# Patient Record
Sex: Male | Born: 1985 | Race: Black or African American | Hispanic: No | Marital: Single | Smoking: Never smoker
Health system: Southern US, Community
[De-identification: ages and names within clinical notes are randomized; demographics above are authoritative.]

---

## 2011-07-01 ENCOUNTER — Emergency Department (HOSPITAL_COMMUNITY)
Admission: EM | Admit: 2011-07-01 | Discharge: 2011-07-01 | Disposition: A | Discharge status: Home / Self Care | Payer: Self-pay | Source: Home / Self Care | Attending: Emergency Medicine | Admitting: Emergency Medicine

## 2011-07-01 ENCOUNTER — Encounter (HOSPITAL_COMMUNITY): Payer: Self-pay | Admitting: Emergency Medicine

## 2011-07-01 DIAGNOSIS — J029 Acute pharyngitis, unspecified: Secondary | ICD-10-CM

## 2011-07-01 DIAGNOSIS — K122 Cellulitis and abscess of mouth: Secondary | ICD-10-CM

## 2011-07-01 LAB — POCT RAPID STREP A: Streptococcus, Group A Screen (Direct): NEGATIVE

## 2011-07-01 MED ORDER — AMOXICILLIN 500 MG PO CAPS: 500.0000 mg | ORAL_CAPSULE | Freq: Three times a day (TID) | ORAL | Status: AC

## 2011-07-01 MED ORDER — PREDNISONE 20 MG PO TABS: 40.0000 mg | ORAL_TABLET | Freq: Every day | ORAL | Status: AC

## 2011-07-01 NOTE — ED Provider Notes (Signed)
History     CSN: [id number]  Arrival date & time 07/01/11  9562   First MD Initiated Contact with Patient 07/01/11 1025      Chief Complaint  Patient presents with  . Sore Throat    (Consider location/radiation/quality/duration/timing/severity/associated sxs/prior treatment) HPI Comments: Patient has been complaining of a sore throat since Thursday as it has been worsening since then and he sees a tiny little black spot on the middle of his throat (points to uvula). "As it's been hurting more within the last 2 days when I swallow and it looks red" , I think have been having some type of sinus congestion for the last few days as well have some nasal congestion and postnasal dripping. No fevers, no cough or shortness of breath. Been using salt water gargles for my throat have not really taking anything else"   Patient is a 26 y.o. male presenting with pharyngitis. The history is provided by the patient.  Sore Throat This is a new problem. The current episode started more than 2 days ago. The problem occurs constantly. The problem has been gradually worsening. Pertinent negatives include no abdominal pain, no headaches and no shortness of breath. The symptoms are aggravated by swallowing. The symptoms are relieved by nothing. He has tried nothing for the symptoms.    History reviewed. No pertinent past medical history.  History reviewed. No pertinent past surgical history.  History reviewed. No pertinent family history.  History  Substance Use Topics  . Smoking status: Never Smoker   . Smokeless tobacco: Not on file  . Alcohol Use: No      Review of Systems  Constitutional: Negative for fever, chills, activity change and appetite change.  HENT: Positive for congestion, sore throat, rhinorrhea and sinus pressure. Negative for ear pain.   Respiratory: Negative for chest tightness, shortness of breath and wheezing.   Gastrointestinal: Negative for abdominal pain.  Neurological:  Negative for headaches.    Allergies  Review of patient's allergies indicates no known allergies.  Home Medications   Current Outpatient Rx  Name Route Sig Dispense Refill  . AMOXICILLIN 500 MG PO CAPS Oral Take 1 capsule (500 mg total) by mouth 3 (three) times daily. 30 capsule 0  . PREDNISONE 20 MG PO TABS Oral Take 2 tablets (40 mg total) by mouth daily. 2 tablets daily for 5 days 10 tablet 0    BP 136/85  Pulse 61  Temp(Src) 97.3 F (36.3 C) (Oral)  Resp 18  SpO2 98%  Physical Exam  Nursing note and vitals reviewed. Constitutional: He appears well-developed and well-nourished.  HENT:  Head: Normocephalic.  Mouth/Throat: Uvula swelling present. Posterior oropharyngeal erythema present.    Eyes: Conjunctivae are normal. Right eye exhibits discharge. Left eye exhibits no discharge.  Neck: Neck supple. No JVD present.  Lymphadenopathy:    He has no cervical adenopathy.    ED Course  Procedures (including critical care time)   Labs Reviewed  POCT RAPID STREP A ([facility] ONLY)   No results found.   1. Uvulitis   2. Pharyngitis       MDM  To localize uvula swelling with a ulceration and with localize left anterior pillar pharyngitis and localize swelling. No signs of pharyngeal peritonsillar abscess. Afebrile with a negative strep test. Have decided to treat patient with antibiotics as the ulceration in and itself looks secondarily infected also with a mild short course of steroid. Instructed patient to followup with [place] if no improvement is  noted after 3-5 days. Patient with treatment plan and agreed to followup as necessary.        Jimmie Molly, MD 07/01/11 [phone]

## 2011-07-01 NOTE — ED Notes (Signed)
C/o of sore throat since last thursday

## 2011-07-01 NOTE — Discharge Instructions (Signed)
Salt Water Gargle  This solution will help make your mouth and throat feel better.  HOME CARE INSTRUCTIONS     Mix 1 teaspoon of salt in 8 ounces of warm water.   Gargle with this solution as much or often as you need or as directed. Swish and gargle gently if you have any sores or wounds in your mouth.   Do not swallow this mixture.  Document Released: 11/02/2003 Document Revised: 01/17/2011 Document Reviewed: 03/25/2008  [employer] Patient Information 2012 [employer].

## 2013-11-03 ENCOUNTER — Ambulatory Visit
Admission: RE | Admit: 2013-11-03 | Discharge: 2013-11-03 | Disposition: A | Discharge status: Home / Self Care | Payer: No Typology Code available for payment source | Source: Ambulatory Visit | Attending: Occupational Medicine | Admitting: Occupational Medicine

## 2013-11-03 ENCOUNTER — Other Ambulatory Visit: Payer: Self-pay | Admitting: Occupational Medicine

## 2013-11-03 DIAGNOSIS — Z021 Encounter for pre-employment examination: Secondary | ICD-10-CM

## 2015-08-21 ENCOUNTER — Ambulatory Visit (INDEPENDENT_AMBULATORY_CARE_PROVIDER_SITE_OTHER): Payer: 59 | Admitting: Physician Assistant

## 2015-08-21 VITALS — BP 136/98 | HR 87 | Temp 98.0°F | Resp 18 | Ht 71.5 in | Wt 196.0 lb

## 2015-08-21 DIAGNOSIS — R300 Dysuria: Secondary | ICD-10-CM | POA: Diagnosis not present

## 2015-08-21 DIAGNOSIS — R350 Frequency of micturition: Secondary | ICD-10-CM

## 2015-08-21 DIAGNOSIS — Z7251 High risk heterosexual behavior: Secondary | ICD-10-CM | POA: Diagnosis not present

## 2015-08-21 LAB — POCT URINALYSIS DIP (MANUAL ENTRY)
BILIRUBIN UA: NEGATIVE
BILIRUBIN UA: NEGATIVE
Blood, UA: NEGATIVE
GLUCOSE UA: NEGATIVE
Leukocytes, UA: NEGATIVE
Nitrite, UA: NEGATIVE
Protein Ur, POC: NEGATIVE
SPEC GRAV UA: 1.01
UROBILINOGEN UA: 0.2
pH, UA: 7

## 2015-08-21 LAB — POC MICROSCOPIC URINALYSIS (UMFC): MUCUS RE: ABSENT

## 2015-08-21 LAB — POCT GLYCOSYLATED HEMOGLOBIN (HGB A1C): HEMOGLOBIN A1C: 5

## 2015-08-21 NOTE — Progress Notes (Signed)
08/21/2015 7:38 PM   DOB: [birth year] / MRN: [id number]  SUBJECTIVE:  AHIJAH DEVERY is a 30 y.o. male presenting for for the evaluation of urinary frequency which started 4 days ago.  Associated symptoms include no other symptoms and the patient denies dysuria, hematuria, abdominal pain, pelvic pain, cloudy malordorous urine, genital rash and genital irritation. The patients has tried nothing for these symptoms. He has a new sexual partner and would like to be screened for STI. Denies penile discharge, testicular pain, dysuria.   He has No Known Allergies.   He  has no past medical history on file.    He  reports that he has never smoked. He does not have any smokeless tobacco history on file. He reports that he does not drink alcohol. He  has no sexual activity history on file. The patient  has no past surgical history on file.  His family history includes Cancer in his mother; Hyperlipidemia in his father.  Review of Systems  Constitutional: Negative for fever and chills.  Neurological: Negative for dizziness and headaches.  Psychiatric/Behavioral: Negative for depression.    Problem list and medications reviewed and updated by myself where necessary, and exist elsewhere in the encounter.   OBJECTIVE:  BP 136/98 mmHg  Pulse 87  Temp(Src) 98 F (36.7 C) (Oral)  Resp 18  Ht 5' 11.5" (1.816 m)  Wt 196 lb (88.905 kg)  BMI 26.96 kg/m2  SpO2 99%  Physical Exam  Constitutional: He is oriented to person, place, and time.  Cardiovascular: Normal rate and regular rhythm.   Pulmonary/Chest: Effort normal and breath sounds normal.  Genitourinary: Testes normal and penis normal.  Neurological: He is alert and oriented to person, place, and time.  Skin: Skin is warm and dry.  Vitals reviewed.   Results for orders placed or performed in visit on 08/21/15 (from the past 48 hour(s))  POCT urinalysis dipstick     Status: Normal   Collection Time: 08/21/15  5:56 PM  Result Value Ref  Range   Color, UA yellow yellow   Clarity, UA clear clear   Glucose, UA negative negative   Bilirubin, UA negative negative   Ketones, POC UA negative negative   Spec Grav, UA 1.010    Blood, UA negative negative   pH, UA 7.0    Protein Ur, POC negative negative   Urobilinogen, UA 0.2    Nitrite, UA Negative Negative   Leukocytes, UA Negative Negative  POCT Microscopic Urinalysis ([facility])     Status: Abnormal   Collection Time: 08/21/15  5:56 PM  Result Value Ref Range   WBC,UR,HPF,POC None None WBC/hpf   RBC,UR,HPF,POC None None RBC/hpf   Bacteria Few (A) None, Too numerous to count   Mucus Absent Absent   Epithelial Cells, UR Per Microscopy Few (A) None, Too numerous to count cells/hpf  POCT glycosylated hemoglobin (Hb A1C)     Status: Normal   Collection Time: 08/21/15  6:59 PM  Result Value Ref Range   Hemoglobin A1C 5.0     ASSESSMENT AND PLAN:  Evans was seen today for urinary frequency and dysuria.  Diagnoses and all orders for this visit:  Urinary frequency: Work up negative thus far.  Advised he cut his water consumption to 0.5 gallons versus 1 gallon daily.  -     POCT urinalysis dipstick -     POCT Microscopic Urinalysis ([facility]) -     POCT glycosylated hemoglobin (Hb A1C) -     COMPLETE  METABOLIC PANEL WITH GFR  High risk sexual behavior: Will screen.  -     HIV antibody -     RPR -     GC/Chlamydia Probe Amp    The patient was advised to call or return to clinic if he does not see an improvement in symptoms or to seek the care of the closest emergency department if he worsens with the above plan.   Deliah BostonMichael Clark, MHS, PA-C [facility] 08/21/2015 7:38 PM

## 2015-08-21 NOTE — Patient Instructions (Signed)
     IF you received an x-ray today, you will receive an invoice from [facility]. Please contact [facility] at [phone] with questions or concerns regarding your invoice.   IF you received labwork today, you will receive an invoice from [facility]. Please contact [facility] at [phone] with questions or concerns regarding your invoice.   Our billing staff will not be able to assist you with questions regarding bills from these companies.  You will be contacted with the lab results as soon as they are available. The fastest way to get your results is to activate your My Chart account. Instructions are located on the last page of this paperwork. If you have not heard from us regarding the results in 2 weeks, please contact this office.      

## 2015-08-22 LAB — COMPLETE METABOLIC PANEL WITH GFR
ALT: 17 U/L (ref 9–46)
AST: 21 U/L (ref 10–40)
Albumin: 4.4 g/dL (ref 3.6–5.1)
Alkaline Phosphatase: 63 U/L (ref 40–115)
BUN: 10 mg/dL (ref 7–25)
CALCIUM: 9.2 mg/dL (ref 8.6–10.3)
CHLORIDE: 102 mmol/L (ref 98–110)
CO2: 25 mmol/L (ref 20–31)
CREATININE: 1.12 mg/dL (ref 0.60–1.35)
Glucose, Bld: 94 mg/dL (ref 65–99)
POTASSIUM: 3.8 mmol/L (ref 3.5–5.3)
Sodium: 141 mmol/L (ref 135–146)
Total Bilirubin: 0.6 mg/dL (ref 0.2–1.2)
Total Protein: 7.2 g/dL (ref 6.1–8.1)

## 2015-08-22 LAB — HIV ANTIBODY (ROUTINE TESTING W REFLEX): HIV: NONREACTIVE

## 2015-08-23 LAB — GC/CHLAMYDIA PROBE AMP
CT Probe RNA: NOT DETECTED
GC PROBE AMP APTIMA: NOT DETECTED

## 2015-08-23 LAB — RPR

## 2015-10-24 MED FILL — FLUTICASONE PROP 50 MCG SPR: 50 | 30 days supply | Qty: 16 | Fill #1

## 2016-05-20 ENCOUNTER — Ambulatory Visit
Admission: RE | Admit: 2016-05-20 | Discharge: 2016-05-20 | Disposition: A | Discharge status: Home / Self Care | Payer: No Typology Code available for payment source | Source: Ambulatory Visit | Attending: Occupational Medicine | Admitting: Occupational Medicine

## 2016-05-20 ENCOUNTER — Other Ambulatory Visit: Payer: Self-pay | Admitting: Occupational Medicine

## 2016-05-20 DIAGNOSIS — Z021 Encounter for pre-employment examination: Secondary | ICD-10-CM

## 2018-10-31 IMAGING — CR DG CHEST 1V
1 series · 1 of 1 positions shown · non-contrast
Comparison: 11/03/2013

CLINICAL DATA: Pre-employment examination

EXAM:
CHEST 1 VIEW

[w chest pa]
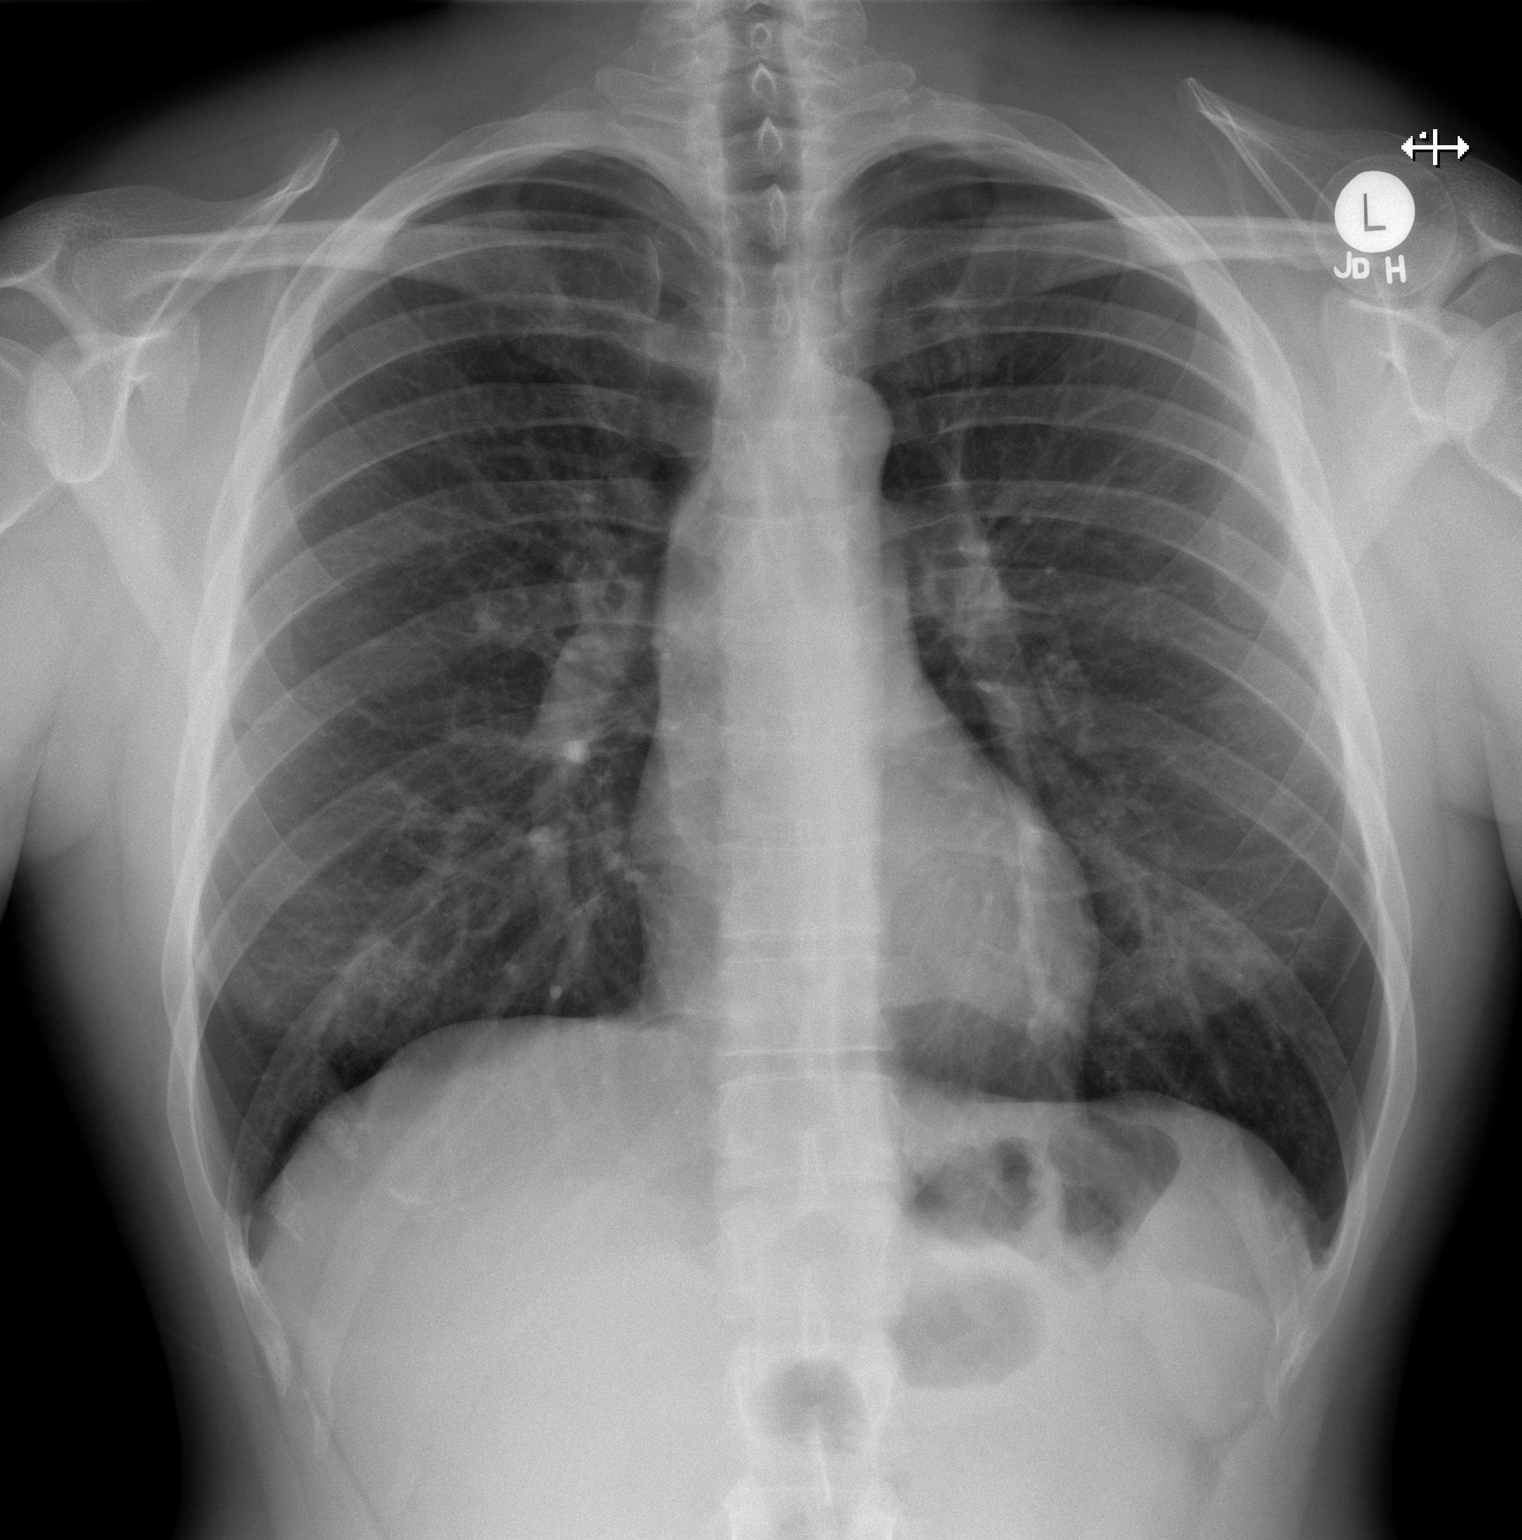

[1 of 1 positions shown; findings below may reference images not displayed]

FINDINGS: Normal heart size, mediastinal contours, and pulmonary vascularity.

Lungs clear.

No pleural effusion or pneumothorax.

Bones unremarkable.
IMPRESSION: Normal exam.

## 2020-06-28 ENCOUNTER — Emergency Department (INDEPENDENT_AMBULATORY_CARE_PROVIDER_SITE_OTHER): Payer: 59

## 2020-06-28 ENCOUNTER — Emergency Department (INDEPENDENT_AMBULATORY_CARE_PROVIDER_SITE_OTHER)
Admission: RE | Admit: 2020-06-28 | Discharge: 2020-06-28 | Disposition: A | Discharge status: Home / Self Care | Payer: 59 | Source: Ambulatory Visit

## 2020-06-28 ENCOUNTER — Other Ambulatory Visit: Payer: Self-pay

## 2020-06-28 VITALS — BP 143/81 | HR 66 | Temp 98.2°F | Resp 18

## 2020-06-28 DIAGNOSIS — R1032 Left lower quadrant pain: Secondary | ICD-10-CM

## 2020-06-28 NOTE — ED Triage Notes (Signed)
Pt c/o lower LT sided abd pain x 2 days. Pain is sharp and intemittent. Denies N/V/D or fever. Pain 6.5/10.

## 2020-06-28 NOTE — ED Provider Notes (Signed)
[facility]    CSN: [id number] Arrival date & time: 06/28/20  1306      History   Chief Complaint Chief Complaint  Patient presents with  . Abdominal Pain    APPT 2PM, LT side    HPI Larry Ballard is a 35 y.o. male.   HPI 35 year old male presents with left lower quadrant abdominal pain for 2 days.  Patient concurrently rates pain as 7 of 10; however, yesterday and the day before he reports lower left abdominal pain was sharp radiating to lower back.  Denies fever, nausea, vomiting, diarrhea, hematochezia, or melena.   History reviewed. No pertinent past medical history.  There are no problems to display for this patient.   History reviewed. No pertinent surgical history.     Home Medications    Prior to Admission medications   Not on File    Family History Family History  Problem Relation Age of Onset  . Cancer Mother   . Hyperlipidemia Father     Social History Social History   Tobacco Use  . Smoking status: Never Smoker  Vaping Use  . Vaping Use: Never used  Substance Use Topics  . Alcohol use: No     Allergies   Patient has no known allergies.   Review of Systems Review of Systems  Constitutional: Negative.   HENT: Negative.   Eyes: Negative.   Respiratory: Negative.   Cardiovascular: Negative.   Gastrointestinal: Positive for abdominal pain.  Genitourinary: Negative.   Musculoskeletal: Negative.   Skin: Negative.   Neurological: Negative.      Physical Exam Triage Vital Signs ED Triage Vitals  Enc Vitals Group     BP 06/28/20 1336 (!) 143/81     Pulse Rate 06/28/20 1336 66     Resp 06/28/20 1336 18     Temp 06/28/20 1336 98.2 F (36.8 C)     Temp Source 06/28/20 1336 Oral     SpO2 06/28/20 1336 99 %     Weight --      Height --      Head Circumference --      Peak Flow --      Pain Score 06/28/20 1338 6     Pain Loc --      Pain Edu? --      Excl. in GC? --    No data found.  Updated Vital Signs BP  (!) 143/81 (BP Location: Right Arm)   Pulse 66   Temp 98.2 F (36.8 C) (Oral)   Resp 18   SpO2 99%   Physical Exam Vitals and nursing note reviewed.  Constitutional:      General: He is not in acute distress.    Appearance: He is well-developed. He is not ill-appearing.  HENT:     Head: Normocephalic and atraumatic.     Mouth/Throat:     Mouth: Mucous membranes are moist.     Pharynx: Oropharynx is clear. No pharyngeal swelling or oropharyngeal exudate.  Eyes:     Extraocular Movements: Extraocular movements intact.     Pupils: Pupils are equal, round, and reactive to light.  Cardiovascular:     Rate and Rhythm: Normal rate and regular rhythm.     Heart sounds: Normal heart sounds.  Pulmonary:     Effort: Pulmonary effort is normal.     Breath sounds: Normal breath sounds.     Comments: No adventitious breath sounds noted Abdominal:     General: Bowel sounds are  absent. There is no distension.     Palpations: Abdomen is soft. There is no shifting dullness, fluid wave, hepatomegaly, splenomegaly, mass or pulsatile mass.     Tenderness: There is abdominal tenderness in the left lower quadrant. There is no right CVA tenderness, left CVA tenderness, guarding or rebound. Negative signs include Murphy's sign and McBurney's sign.  Skin:    General: Skin is warm and dry.  Neurological:     General: No focal deficit present.     Mental Status: He is alert and oriented to person, place, and time.  Psychiatric:        Mood and Affect: Mood normal.        Behavior: Behavior normal.      UC Treatments / Results  Labs (all labs ordered are listed, but only abnormal results are displayed) Labs Reviewed - No data to display  EKG   Radiology CT ABDOMEN PELVIS WO CONTRAST  Result Date: 06/28/2020 CLINICAL DATA:  Left-sided pain for 2 days. Contrast withheld at clinical service request. EXAM: CT ABDOMEN AND PELVIS WITHOUT CONTRAST TECHNIQUE: Multidetector CT imaging of the abdomen  and pelvis was performed following the standard protocol without IV contrast. COMPARISON:  None. FINDINGS: Lower chest: Clear lung bases. Normal heart size without pericardial or pleural effusion. Hepatobiliary: Normal liver. Normal gallbladder, without biliary ductal dilatation. Pancreas: Normal, without mass or ductal dilatation. Spleen: Normal in size, without focal abnormality. Adrenals/Urinary Tract: Normal adrenal glands. No renal calculi or hydronephrosis. No hydroureter or ureteric calculi. Left pelvic calcifications, including on 79/2, are favored to be separate from the ureter. No bladder calculi. Stomach/Bowel: Normal stomach, without wall thickening. Normal colon and terminal ileum. Appendix not visualized. Normal small bowel. Vascular/Lymphatic: Normal caliber of the aorta and branch vessels. No abdominopelvic adenopathy. Reproductive: Normal prostate. Other: No significant free fluid.  No free intraperitoneal air. Musculoskeletal: Heterogeneous sclerosis involving the right iliac bone including at 2.7 cm with extension beyond the cortex. Example 51/5. Mild disc bulges at L4-5 and L5-S1. IMPRESSION: 1. No upper tract calculi or obstructive uropathy. Left pelvic calcifications which are favored to be separate from the ureter, especially given absence of obstructive signs. Correlate with urinalysis. 2. No other explanation for patient's symptoms. 3. 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 Recommend nonemergent pelvic plain films, which will allow ongoing surveillance. Electronically Signed   By: Jeronimo Greaves M.D.   On: 06/28/2020 15:19    Procedures Procedures (including critical care time)  Medications Ordered in UC Medications - No data to display  Initial Impression / Assessment and Plan / UC Course  I have reviewed the triage vital signs and the nursing notes.  Pertinent labs & imaging results that were available during my care of the  patient were reviewed by me and considered in my medical decision making (see chart for details).    MDM: 1. LLQ Abdominal pain.  Patient provided findings of CT of abdomen and pelvis with radiology recommendations of nonemergent pelvic plain film to allow ongoing surveillance of benign entity (sclerotic lesion) from the right iliac crest. Final Clinical Impressions(s) / UC Diagnoses   Final diagnoses:  Abdominal pain, left lower quadrant     Discharge Instructions     CT of abdomen and pelvis without contrast revealed no upper tract calculi or obstructive uropathy left pelvic calcifications which are favored to be separate from the ureter especially given absence of obstructive signs.  Right iliac sclerotic lesion is favored  to represent a benign entity such as melorrheatosis.  Recommended nonemergent pelvic plain films will allow ongoing surveillance.  Advised patient to have repeat x-ray of pelvis in 3 months.    ED Prescriptions    None     PDMP not reviewed this encounter.   Trevor Iha, FNP 06/28/20 1554

## 2020-06-28 NOTE — Discharge Instructions (Signed)
CT of abdomen and pelvis without contrast revealed no upper tract calculi or obstructive uropathy left pelvic calcifications which are favored to be separate from the ureter especially given absence of obstructive signs.  Right iliac sclerotic lesion is favored to represent a benign entity such as melorrheatosis.  Recommended nonemergent pelvic plain films will allow ongoing surveillance.  Advised patient to have repeat x-ray of pelvis in 3 months.

## 2020-07-05 ENCOUNTER — Other Ambulatory Visit: Payer: Self-pay | Admitting: Internal Medicine

## 2020-07-05 ENCOUNTER — Ambulatory Visit
Admission: RE | Admit: 2020-07-05 | Discharge: 2020-07-05 | Disposition: A | Discharge status: Home / Self Care | Payer: 59 | Source: Ambulatory Visit | Attending: Internal Medicine | Admitting: Internal Medicine

## 2020-07-05 DIAGNOSIS — R935 Abnormal findings on diagnostic imaging of other abdominal regions, including retroperitoneum: Secondary | ICD-10-CM

## 2021-07-29 ENCOUNTER — Emergency Department (HOSPITAL_BASED_OUTPATIENT_CLINIC_OR_DEPARTMENT_OTHER)
Admission: EM | Admit: 2021-07-29 | Discharge: 2021-07-29 | Disposition: A | Discharge status: Home / Self Care | Payer: No Typology Code available for payment source | Attending: Emergency Medicine | Admitting: Emergency Medicine

## 2021-07-29 ENCOUNTER — Other Ambulatory Visit: Payer: Self-pay

## 2021-07-29 ENCOUNTER — Emergency Department (HOSPITAL_BASED_OUTPATIENT_CLINIC_OR_DEPARTMENT_OTHER): Payer: No Typology Code available for payment source

## 2021-07-29 ENCOUNTER — Encounter (HOSPITAL_BASED_OUTPATIENT_CLINIC_OR_DEPARTMENT_OTHER): Payer: Self-pay | Admitting: Emergency Medicine

## 2021-07-29 DIAGNOSIS — R079 Chest pain, unspecified: Secondary | ICD-10-CM | POA: Insufficient documentation

## 2021-07-29 DIAGNOSIS — R1011 Right upper quadrant pain: Secondary | ICD-10-CM | POA: Diagnosis not present

## 2021-07-29 DIAGNOSIS — M7918 Myalgia, other site: Secondary | ICD-10-CM

## 2021-07-29 LAB — CBC WITH DIFFERENTIAL/PLATELET
Abs Immature Granulocytes: 0.03 10*3/uL (ref 0.00–0.07)
Basophils Absolute: 0 10*3/uL (ref 0.0–0.1)
Basophils Relative: 0 %
Eosinophils Absolute: 0.1 10*3/uL (ref 0.0–0.5)
Eosinophils Relative: 1 %
HCT: 45.3 % (ref 39.0–52.0)
Hemoglobin: 15.8 g/dL (ref 13.0–17.0)
Immature Granulocytes: 0 %
Lymphocytes Relative: 17 %
Lymphs Abs: 1.6 10*3/uL (ref 0.7–4.0)
MCH: 29.8 pg (ref 26.0–34.0)
MCHC: 34.9 g/dL (ref 30.0–36.0)
MCV: 85.5 fL (ref 80.0–100.0)
Monocytes Absolute: 0.9 10*3/uL (ref 0.1–1.0)
Monocytes Relative: 10 %
Neutro Abs: 6.7 10*3/uL (ref 1.7–7.7)
Neutrophils Relative %: 72 %
Platelets: 192 10*3/uL (ref 150–400)
RBC: 5.3 MIL/uL (ref 4.22–5.81)
RDW: 12.6 % (ref 11.5–15.5)
WBC: 9.4 10*3/uL (ref 4.0–10.5)
nRBC: 0 % (ref 0.0–0.2)

## 2021-07-29 LAB — URINALYSIS, ROUTINE W REFLEX MICROSCOPIC
Bilirubin Urine: NEGATIVE
Glucose, UA: NEGATIVE mg/dL
Hgb urine dipstick: NEGATIVE
Ketones, ur: NEGATIVE mg/dL
Leukocytes,Ua: NEGATIVE
Nitrite: NEGATIVE
Protein, ur: NEGATIVE mg/dL
Specific Gravity, Urine: 1.01 (ref 1.005–1.030)
pH: 7 (ref 5.0–8.0)

## 2021-07-29 LAB — COMPREHENSIVE METABOLIC PANEL
ALT: 26 U/L (ref 0–44)
AST: 22 U/L (ref 15–41)
Albumin: 4.2 g/dL (ref 3.5–5.0)
Alkaline Phosphatase: 65 U/L (ref 38–126)
Anion gap: 4 — ABNORMAL LOW (ref 5–15)
BUN: 14 mg/dL (ref 6–20)
CO2: 27 mmol/L (ref 22–32)
Calcium: 9 mg/dL (ref 8.9–10.3)
Chloride: 105 mmol/L (ref 98–111)
Creatinine, Ser: 0.92 mg/dL (ref 0.61–1.24)
GFR, Estimated: >60 mL/min (ref >60)
Glucose, Bld: 98 mg/dL (ref 70–99)
Potassium: 3.8 mmol/L (ref 3.5–5.1)
Sodium: 136 mmol/L (ref 135–145)
Total Bilirubin: 1.3 mg/dL — ABNORMAL HIGH (ref 0.3–1.2)
Total Protein: 7.7 g/dL (ref 6.5–8.1)

## 2021-07-29 LAB — TROPONIN I (HIGH SENSITIVITY): Troponin I (High Sensitivity): 3 ng/L (ref <18)

## 2021-07-29 MED ORDER — LACTATED RINGERS IV BOLUS
1000.0000 mL | Freq: Once | INTRAVENOUS | Status: AC
  Administered 2021-07-29: 1000 mL via INTRAVENOUS

## 2021-07-29 MED ORDER — IOHEXOL 300 MG/ML  SOLN
100.0000 mL | Freq: Once | INTRAMUSCULAR | Status: AC | PRN
  Administered 2021-07-29: 100 mL via INTRAVENOUS

## 2021-07-29 NOTE — Discharge Instructions (Signed)
Your work-up today was overall very reassuring.  Your CT was negative as well as your labs.  I suspect that you have some muscular pain meds causing him discomfort.  You can take Motrin or Tylenol as needed for your pain as well as put on lidocaine patches.  You can get these over-the-counter from any drugstore-they are called Salonpas.

## 2021-07-29 NOTE — ED Triage Notes (Signed)
Patient states that he started having chest pain Friday but the pain got stronger last night .States that the pain radiate down to his right side

## 2021-07-29 NOTE — ED Provider Notes (Signed)
[facility] EMERGENCY DEPARTMENT Provider Note   CSN: [id number] Arrival date & time: 07/29/21  1012     History  Chief Complaint  Patient presents with   Chest Pain    Larry Ballard is a 36 y.o. male who presents to the ED for evaluation of right-sided lower chest pain radiating down into his right lower quadrant.  Pain started approximately 2 days ago and has been progressively worsening.  He describes the pain as sharp/shooting and worse when he is bending over or twisting to the side.  No treatment prior to arrival.  He denies fever, chills, urinary symptoms, cough, congestion shortness of breath, nausea, vomiting and diarrhea.  No previous history of cardiac disease or gallbladder issues.  Chest Pain      Home Medications Prior to Admission medications   Not on File      Allergies    Patient has no known allergies.    Review of Systems   Review of Systems  Cardiovascular:  Positive for chest pain.    Physical Exam Updated Vital Signs BP (!) 126/91   Pulse 66   Temp 98.4 F (36.9 C) (Oral)   Resp 14   SpO2 100%  Physical Exam Vitals and nursing note reviewed.  Constitutional:      General: He is not in acute distress.    Appearance: He is not ill-appearing.  HENT:     Head: Atraumatic.  Eyes:     Conjunctiva/sclera: Conjunctivae normal.  Cardiovascular:     Rate and Rhythm: Normal rate and regular rhythm.     Pulses: Normal pulses.          Radial pulses are 2+ on the right side and 2+ on the left side.       Dorsalis pedis pulses are 2+ on the right side and 2+ on the left side.     Heart sounds: No murmur heard. Pulmonary:     Effort: Pulmonary effort is normal. No respiratory distress.     Breath sounds: Normal breath sounds.  Chest:     Chest wall: No deformity or tenderness.  Abdominal:     General: Abdomen is flat. There is no distension.     Palpations: Abdomen is soft.     Tenderness: There is abdominal tenderness in the right  upper quadrant.     Comments: Mild RUQ tenderness, negative Murphy, negative McBurney sign  Musculoskeletal:        General: Normal range of motion.     Cervical back: Normal range of motion.  Skin:    General: Skin is warm and dry.     Capillary Refill: Capillary refill takes less than 2 seconds.  Neurological:     General: No focal deficit present.     Mental Status: He is alert.  Psychiatric:        Mood and Affect: Mood normal.     ED Results / Procedures / Treatments   Labs (all labs ordered are listed, but only abnormal results are displayed) Labs Reviewed  COMPREHENSIVE METABOLIC PANEL - Abnormal; Notable for the following components:      Result Value   Total Bilirubin 1.3 (*)    Anion gap 4 (*)    All other components within normal limits  CBC WITH DIFFERENTIAL/PLATELET  URINALYSIS, ROUTINE W REFLEX MICROSCOPIC  TROPONIN I (HIGH SENSITIVITY)  TROPONIN I (HIGH SENSITIVITY)    EKG EKG Interpretation  Date/Time:  Sunday July 29 2021 10:21:59 EDT Ventricular Rate:  70 PR Interval:  170 QRS Duration: 100 QT Interval:  391 QTC Calculation: 422 R Axis:   163 Text Interpretation: Sinus rhythm Right axis deviation No previous ECGs available Confirmed by Alvira Monday ([id number]) on 07/29/2021 11:23:23 AM  Radiology CT ABDOMEN PELVIS W CONTRAST  Result Date: 07/29/2021 CLINICAL DATA:  36 year old male history of flank pain. EXAM: CT ABDOMEN AND PELVIS WITH CONTRAST TECHNIQUE: Multidetector CT imaging of the abdomen and pelvis was performed using the standard protocol following bolus administration of intravenous contrast. RADIATION DOSE REDUCTION: This exam was performed according to the departmental dose-optimization program which includes automated exposure control, adjustment of the mA and/or kV according to patient size and/or use of iterative reconstruction technique. CONTRAST:  OMNIPAQUE IOHEXOL 300 MG/ML  SOLN COMPARISON:  CT the abdomen and pelvis 06/28/2020.  FINDINGS: Lower chest: Unremarkable. Hepatobiliary: No suspicious cystic or solid hepatic lesions. No intra or extrahepatic biliary ductal dilatation. Gallbladder is normal in appearance. Pancreas: No pancreatic mass. No pancreatic ductal dilatation. No pancreatic or peripancreatic fluid collections or inflammatory changes. Spleen: Unremarkable. Adrenals/Urinary Tract: Bilateral kidneys and bilateral adrenal glands are normal in appearance. No hydroureteronephrosis. Urinary bladder is normal in appearance. Stomach/Bowel: The appearance of the stomach is normal. No pathologic dilatation of small bowel or colon. The appendix is not confidently identified and may be surgically absent. Regardless, there are no inflammatory changes noted adjacent to the cecum to suggest the presence of an acute appendicitis at this time. Vascular/Lymphatic: No significant atherosclerotic disease, aneurysm or dissection noted in the abdominal or pelvic vasculature. No lymphadenopathy noted in the abdomen or pelvis. Reproductive: Prostate gland and seminal vesicles are unremarkable in appearance. Other: No significant volume of ascites.  No pneumoperitoneum. Musculoskeletal: Large sclerotic area in the right ilium, stable compared to the prior study, presumably benign. There are no aggressive appearing lytic or blastic lesions noted in the visualized portions of the skeleton. IMPRESSION: 1. No acute findings are noted in the abdomen or pelvis to account for the patient's symptoms. Electronically Signed   By: Trudie Reed M.D.   On: 07/29/2021 12:05    Procedures Procedures    Medications Ordered in ED Medications  lactated ringers bolus 1,000 mL ( Intravenous Stopped 07/29/21 1222)  iohexol (OMNIPAQUE) 300 MG/ML solution 100 mL (100 mLs Intravenous Contrast Given 07/29/21 1128)    ED Course/ Medical Decision Making/ A&P                           Medical Decision Making Amount and/or Complexity of Data Reviewed Labs:  ordered. Radiology: ordered.   Social determinants of health:  Social History   Socioeconomic History   Marital status: Single    Spouse name: Not on file   Number of children: Not on file   Years of education: Not on file   Highest education level: Not on file  Occupational History   Not on file  Tobacco Use   Smoking status: Never   Smokeless tobacco: Not on file  Vaping Use   Vaping Use: Never used  Substance and Sexual Activity   Alcohol use: No   Drug use: Not on file   Sexual activity: Not on file  Other Topics Concern   Not on file  Social History Narrative   Not on file   Social Determinants of Health   Financial Resource Strain: Not on file  Food Insecurity: Not on file  Transportation Needs: Not on file  Physical Activity:  Not on file  Stress: Not on file  Social Connections: Not on file  Intimate Partner Violence: Not on file     Initial impression:  This patient presents to the ED for concern of right sided chest pain/right upper quadrant pain, this involves an extensive number of treatment options, and is a complaint that carries with it a high risk of complications and morbidity.   Differentials include pneumonia, PE, ACS, cholecystitis, cholelithiasis, kidney stone, pyelonephritis, muscular pain.   Comorbidities affecting care:  none  Additional history obtained: none  Lab Tests  I Ordered, reviewed, and interpreted labs and EKG.  The pertinent results include:  CMP, CBC and UA normal Troponin normal  Imaging Studies ordered:  I ordered imaging studies including  CT abdomen without acute findings  I independently visualized and interpreted imaging and I agree with the radiologist interpretation.   EKG: NSR  Cardiac Monitoring:  The patient was maintained on a cardiac monitor.  I personally viewed and interpreted the cardiac monitored which showed an underlying rhythm of: NSR   Medicines ordered and prescription drug  management:  I ordered medication including: LR bolus   Reevaluation of the patient after these medicines showed that the patient stayed the same I have reviewed the patients home medicines and have made adjustments as needed   ED Course/Re-evaluation: Vitals without abnormality. He has no chest wall tenderness but does have mild TTP to the RUQ. Symptoms not consistent with PE and pt has no tachycardia. Troponin normal and EKG reassuring. Pt symptoms appear more associated with abdominal pathology so CT abdomen was obtained and was negative. He has no cough, congestion, and lungs CTA bilaterally so chest xray was not obtained. Labs all normal. Given extensive and reassuring workup, I do not think there is an emergent cause for patient's pain. Pain is likely muscular. Discussed OTC meds and lidocaine patch. Pt expresses understanding and is amenable to plan.   Disposition:  After consideration of the diagnostic results, physical exam, history and the patients response to treatment feel that the patent would benefit from discharge.   Musculoskeletal Pain: Plan and management as described above. Discharged home in good condition. Final Clinical Impression(s) / ED Diagnoses Final diagnoses:  Musculoskeletal pain    Rx / DC Orders ED Discharge Orders     None         Delight Ovens 07/29/21 1323    Alvira Monday, MD 07/29/21 2055

## 2024-01-09 IMAGING — CT CT ABD-PELV W/ CM
2 of 4 series · 15 of 46 positions shown, 17 images · IV contrast (Omnipaque)
Comparison: CT the abdomen and pelvis 06/28/2020.

CLINICAL DATA: 35-year-old male history of flank pain.

EXAM:
CT ABDOMEN AND PELVIS WITH CONTRAST
TECHNIQUE: Multidetector CT imaging of the abdomen and pelvis was performed
using the standard protocol following bolus administration of
intravenous contrast.

[Series 2: axial st · axial · 0.88mm/px · z∈[+548,+1028]mm · 12 of 106 slices shown, 14 images]
[im 5/106  soft-tissue]
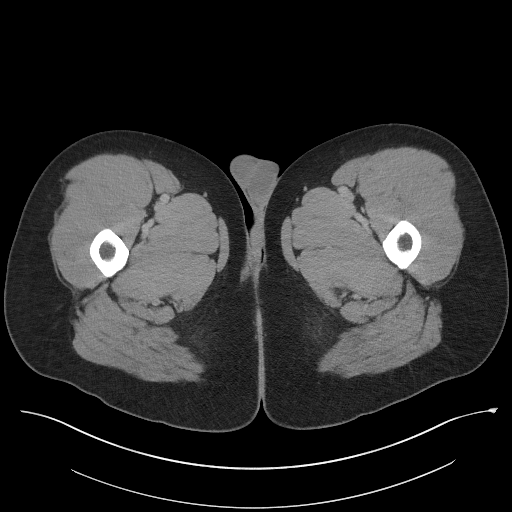
[im 5/106  bone]
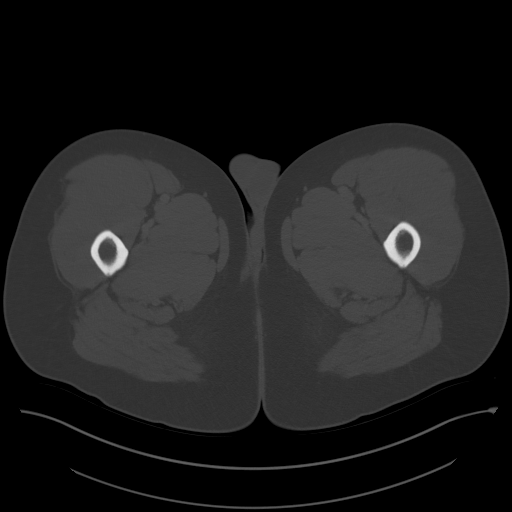
[im 14/106  soft-tissue]
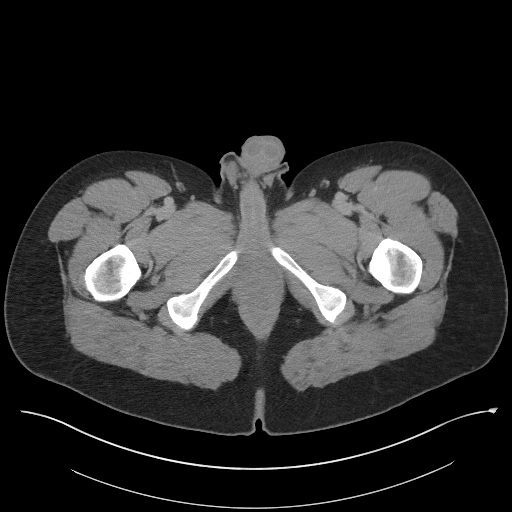
[im 22/106  soft-tissue]
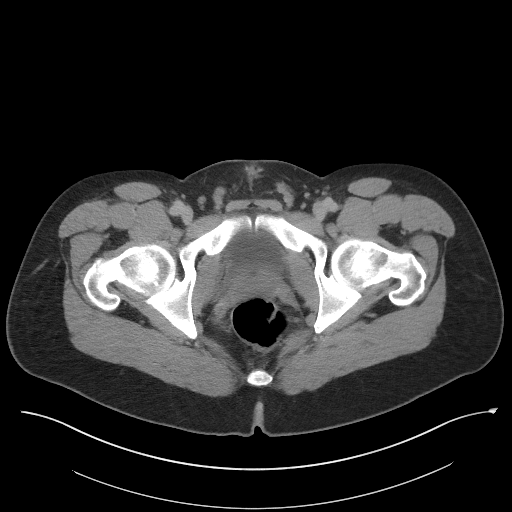
[im 31/106  soft-tissue]
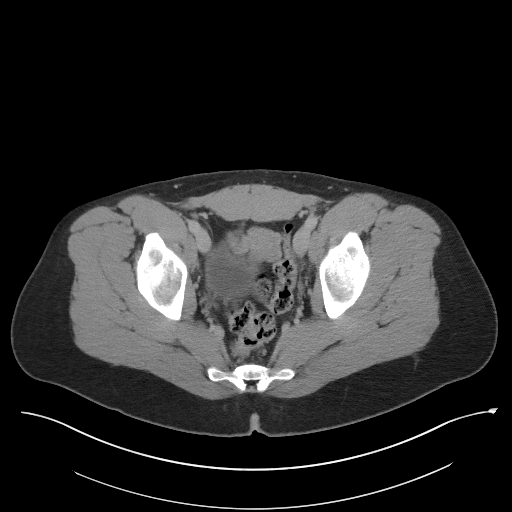
[im 40/106  soft-tissue]
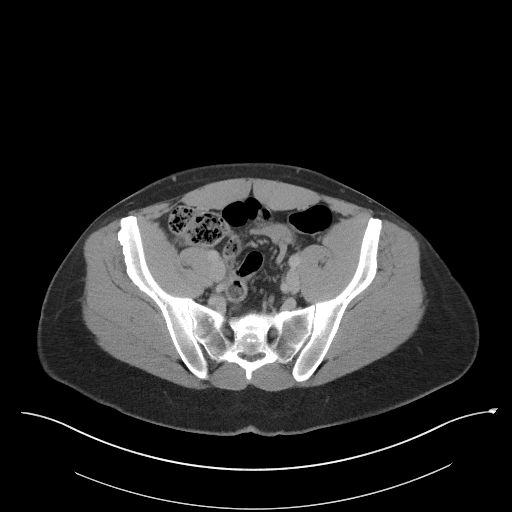
[im 49/106  soft-tissue]
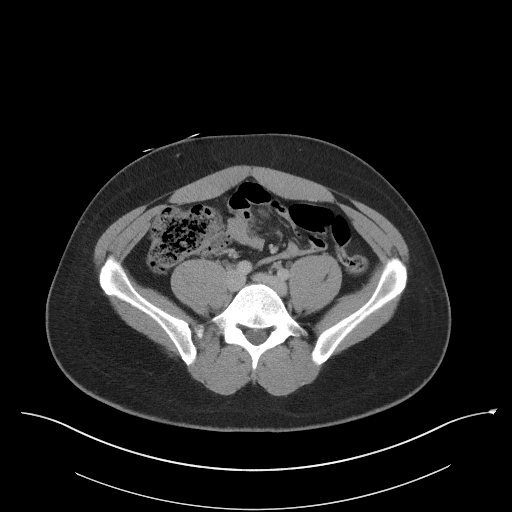
[im 57/106  soft-tissue]
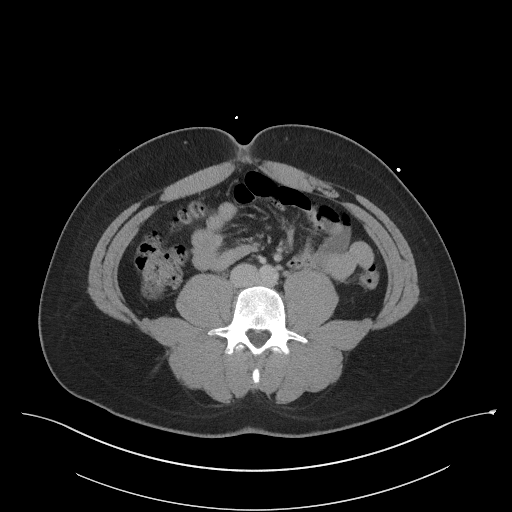
[im 66/106  soft-tissue]
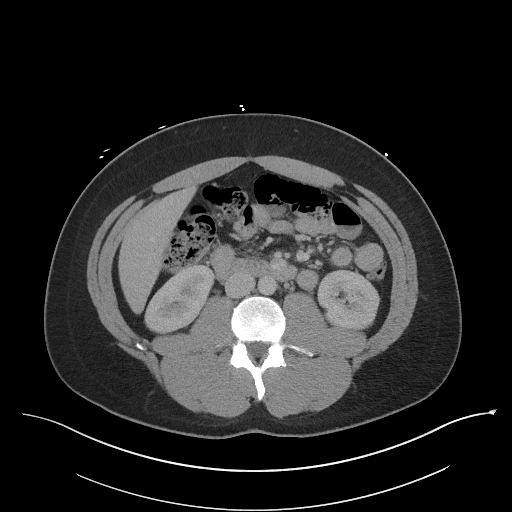
[im 75/106  soft-tissue]
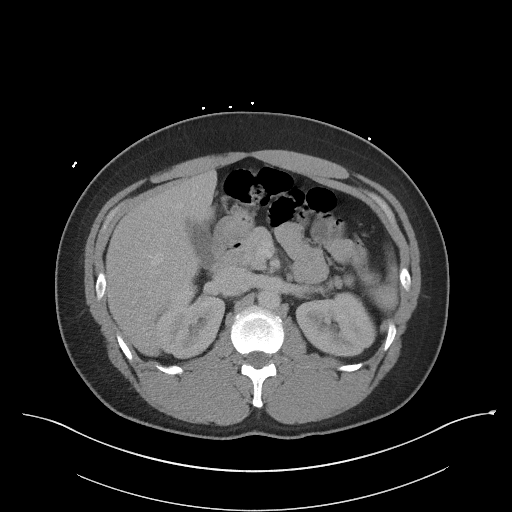
[im 75/106  bone]
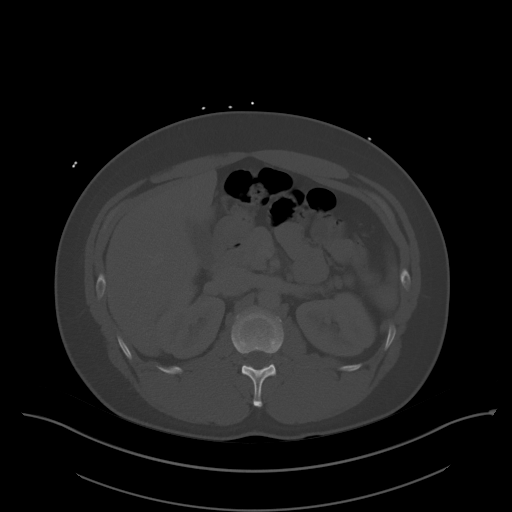
[im 84/106  soft-tissue]
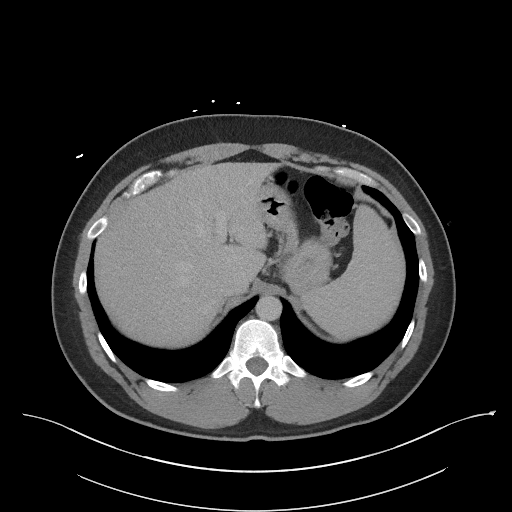
[im 92/106  soft-tissue]
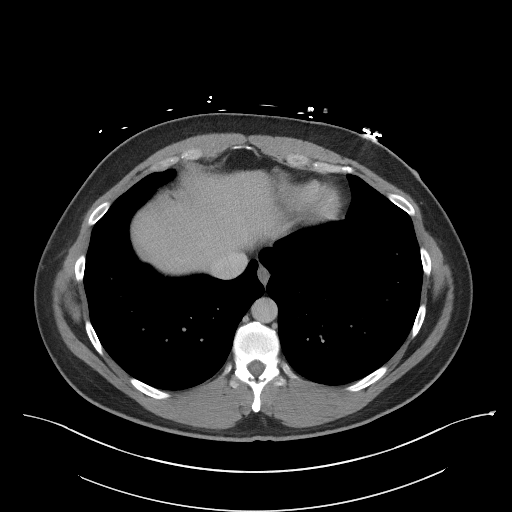
[im 101/106  soft-tissue]
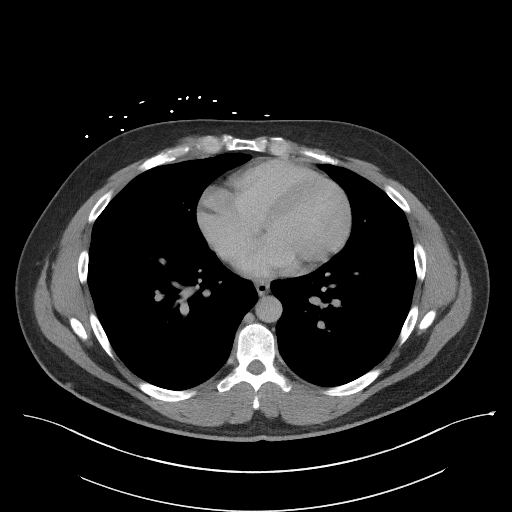

[Series 5: coronal st · coronal · 0.79mm/px · 3 of 101 slices shown]
[im 34/101  soft-tissue]
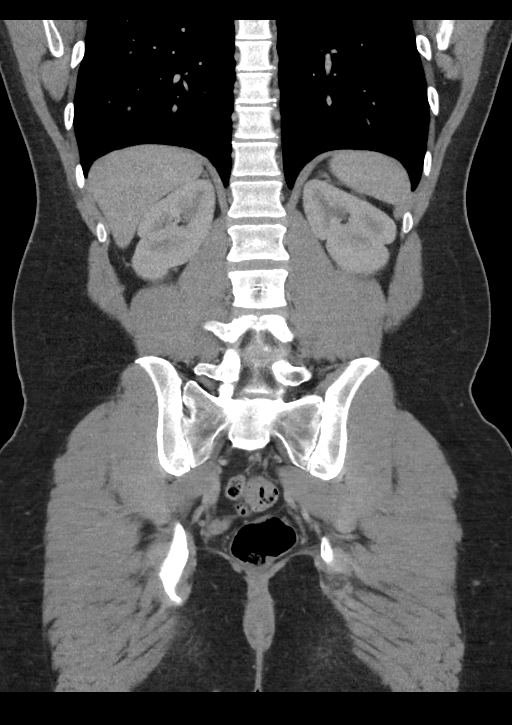
[im 45/101  soft-tissue]
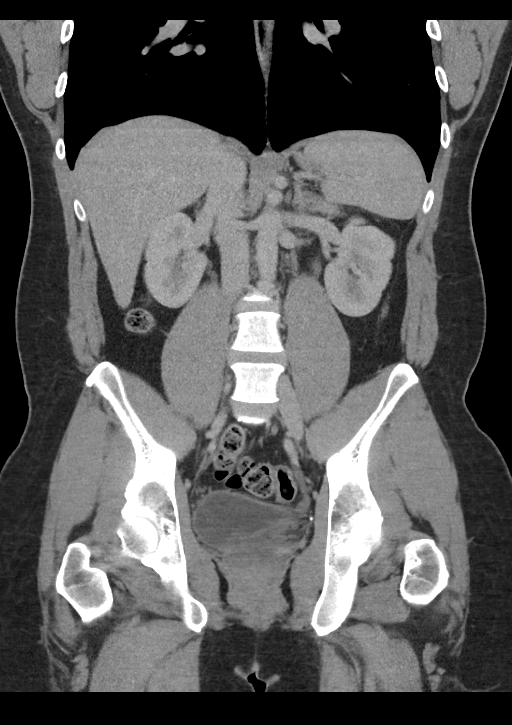
[im 56/101  soft-tissue]
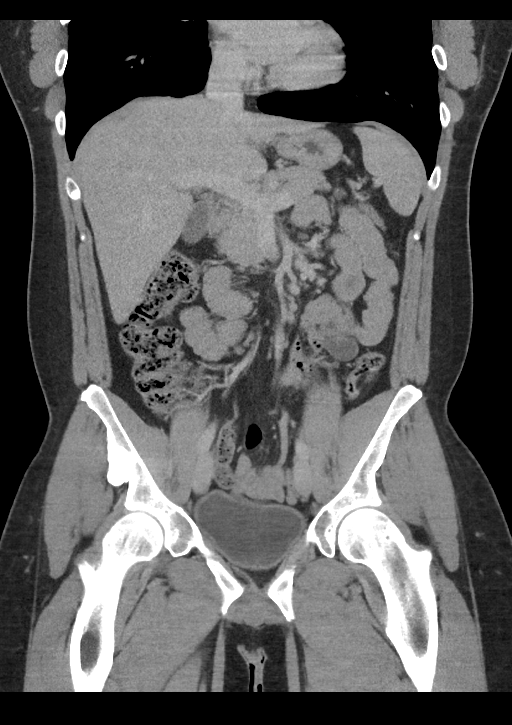

[15 of 46 positions shown; findings below may reference images not displayed]

RADIATION DOSE REDUCTION: This exam was performed according to the
departmental dose-optimization program which includes automated
exposure control, adjustment of the mA and/or kV according to
patient size and/or use of iterative reconstruction technique.

CONTRAST:  100mL OMNIPAQUE IOHEXOL 300 MG/ML  SOLN
FINDINGS: Lower chest: Unremarkable.

Hepatobiliary: No suspicious cystic or solid hepatic lesions. No
intra or extrahepatic biliary ductal dilatation. Gallbladder is
normal in appearance.

Pancreas: No pancreatic mass. No pancreatic ductal dilatation. No
pancreatic or peripancreatic fluid collections or inflammatory
changes.

Spleen: Unremarkable.

Adrenals/Urinary Tract: Bilateral kidneys and bilateral adrenal
glands are normal in appearance. No hydroureteronephrosis. Urinary
bladder is normal in appearance.

Stomach/Bowel: The appearance of the stomach is normal. No
pathologic dilatation of small bowel or colon. The appendix is not
confidently identified and may be surgically absent. Regardless,
there are no inflammatory changes noted adjacent to the cecum to
suggest the presence of an acute appendicitis at this time.

Vascular/Lymphatic: No significant atherosclerotic disease, aneurysm
or dissection noted in the abdominal or pelvic vasculature. No
lymphadenopathy noted in the abdomen or pelvis.

Reproductive: Prostate gland and seminal vesicles are unremarkable
in appearance.

Other: No significant volume of ascites.  No pneumoperitoneum.

Musculoskeletal: Large sclerotic area in the right ilium, stable
compared to the prior study, presumably benign. There are no
aggressive appearing lytic or blastic lesions noted in the
visualized portions of the skeleton.
IMPRESSION: 1. No acute findings are noted in the abdomen or pelvis to account
for the patient's symptoms.
